# Patient Record
Sex: Female | Born: 1965 | Race: White | Hispanic: No | Marital: Married | Smoking: Never smoker
Health system: Southern US, Community
[De-identification: ages and names within clinical notes are randomized; demographics above are authoritative.]

## PROBLEM LIST (undated history)

## (undated) DIAGNOSIS — J45909 Unspecified asthma, uncomplicated: Secondary | ICD-10-CM

---

## 2014-06-28 ENCOUNTER — Other Ambulatory Visit (HOSPITAL_COMMUNITY): Payer: Self-pay | Admitting: Family Medicine

## 2014-06-28 DIAGNOSIS — Z1231 Encounter for screening mammogram for malignant neoplasm of breast: Secondary | ICD-10-CM

## 2014-07-06 ENCOUNTER — Ambulatory Visit (HOSPITAL_COMMUNITY)
Admission: RE | Admit: 2014-07-06 | Discharge: 2014-07-06 | Disposition: A | Discharge status: Home / Self Care | Source: Ambulatory Visit | Attending: Family Medicine | Admitting: Family Medicine

## 2014-07-06 DIAGNOSIS — Z1231 Encounter for screening mammogram for malignant neoplasm of breast: Secondary | ICD-10-CM | POA: Diagnosis present

## 2014-12-17 ENCOUNTER — Encounter (HOSPITAL_COMMUNITY): Payer: Self-pay | Admitting: *Deleted

## 2014-12-17 ENCOUNTER — Emergency Department (HOSPITAL_COMMUNITY)

## 2014-12-17 ENCOUNTER — Emergency Department (HOSPITAL_COMMUNITY)
Admission: EM | Admit: 2014-12-17 | Discharge: 2014-12-17 | Disposition: A | Discharge status: Home / Self Care | Attending: Emergency Medicine | Admitting: Emergency Medicine

## 2014-12-17 DIAGNOSIS — J45909 Unspecified asthma, uncomplicated: Secondary | ICD-10-CM | POA: Diagnosis not present

## 2014-12-17 DIAGNOSIS — R1010 Upper abdominal pain, unspecified: Secondary | ICD-10-CM | POA: Diagnosis present

## 2014-12-17 DIAGNOSIS — Z3202 Encounter for pregnancy test, result negative: Secondary | ICD-10-CM | POA: Insufficient documentation

## 2014-12-17 HISTORY — DX: Unspecified asthma, uncomplicated: J45.909

## 2014-12-17 LAB — URINALYSIS, ROUTINE W REFLEX MICROSCOPIC
Bilirubin Urine: NEGATIVE
Glucose, UA: NEGATIVE mg/dL
HGB URINE DIPSTICK: NEGATIVE
Ketones, ur: NEGATIVE mg/dL
Nitrite: NEGATIVE
PH: 7 (ref 5.0–8.0)
PROTEIN: NEGATIVE mg/dL
SPECIFIC GRAVITY, URINE: 1.007 (ref 1.005–1.030)
Urobilinogen, UA: 0.2 mg/dL (ref 0.0–1.0)

## 2014-12-17 LAB — CBC WITH DIFFERENTIAL/PLATELET
BASOS ABS: 0 10*3/uL (ref 0.0–0.1)
Basophils Relative: 1 % (ref 0–1)
Eosinophils Absolute: 0.1 10*3/uL (ref 0.0–0.7)
Eosinophils Relative: 3 % (ref 0–5)
HEMATOCRIT: 39.6 % (ref 36.0–46.0)
HEMOGLOBIN: 13.7 g/dL (ref 12.0–15.0)
LYMPHS PCT: 34 % (ref 12–46)
Lymphs Abs: 1.6 10*3/uL (ref 0.7–4.0)
MCH: 31.7 pg (ref 26.0–34.0)
MCHC: 34.6 g/dL (ref 30.0–36.0)
MCV: 91.7 fL (ref 78.0–100.0)
Monocytes Absolute: 0.4 10*3/uL (ref 0.1–1.0)
Monocytes Relative: 8 % (ref 3–12)
Neutro Abs: 2.7 10*3/uL (ref 1.7–7.7)
Neutrophils Relative %: 54 % (ref 43–77)
Platelets: 135 10*3/uL — ABNORMAL LOW (ref 150–400)
RBC: 4.32 MIL/uL (ref 3.87–5.11)
RDW: 12.1 % (ref 11.5–15.5)
WBC: 4.9 10*3/uL (ref 4.0–10.5)

## 2014-12-17 LAB — COMPREHENSIVE METABOLIC PANEL
ALBUMIN: 4 g/dL (ref 3.5–5.0)
ALT: 23 U/L (ref 14–54)
AST: 24 U/L (ref 15–41)
Alkaline Phosphatase: 31 U/L — ABNORMAL LOW (ref 38–126)
Anion gap: 6 (ref 5–15)
BUN: 10 mg/dL (ref 6–20)
CO2: 26 mmol/L (ref 22–32)
Calcium: 9.4 mg/dL (ref 8.9–10.3)
Chloride: 106 mmol/L (ref 101–111)
Creatinine, Ser: 0.95 mg/dL (ref 0.44–1.00)
GFR calc Af Amer: >60 mL/min (ref >60)
GFR calc non Af Amer: >60 mL/min (ref >60)
Glucose, Bld: 103 mg/dL — ABNORMAL HIGH (ref 65–99)
POTASSIUM: 4.3 mmol/L (ref 3.5–5.1)
SODIUM: 138 mmol/L (ref 135–145)
TOTAL PROTEIN: 6.2 g/dL — AB (ref 6.5–8.1)
Total Bilirubin: 0.9 mg/dL (ref 0.3–1.2)

## 2014-12-17 LAB — URINE MICROSCOPIC-ADD ON

## 2014-12-17 LAB — PREGNANCY, URINE: PREG TEST UR: NEGATIVE

## 2014-12-17 LAB — LIPASE, BLOOD: Lipase: 19 U/L — ABNORMAL LOW (ref 22–51)

## 2014-12-17 MED ORDER — SODIUM CHLORIDE 0.9 % IV SOLN
INTRAVENOUS | Status: DC
  Administered 2014-12-17: 18:00:00 via INTRAVENOUS

## 2014-12-17 MED ORDER — IOHEXOL 300 MG/ML  SOLN
100.0000 mL | Freq: Once | INTRAMUSCULAR | Status: AC | PRN
  Administered 2014-12-17: 100 mL via INTRAVENOUS

## 2014-12-17 MED ORDER — SENNOSIDES-DOCUSATE SODIUM 8.6-50 MG PO TABS: 2.0000 | ORAL_TABLET | Freq: Every day | ORAL | Status: AC

## 2014-12-17 MED ORDER — FENTANYL CITRATE (PF) 100 MCG/2ML IJ SOLN
25.0000 ug | Freq: Once | INTRAMUSCULAR | Status: AC
  Administered 2014-12-17: 25 ug via INTRAVENOUS
  Filled 2014-12-17: qty 2

## 2014-12-17 MED ORDER — MAGNESIUM CITRATE PO SOLN: 1.0000 | Freq: Once | ORAL | Status: AC

## 2014-12-17 MED ORDER — GI COCKTAIL ~~LOC~~
30.0000 mL | Freq: Once | ORAL | Status: AC
  Administered 2014-12-17: 30 mL via ORAL
  Filled 2014-12-17: qty 30

## 2014-12-17 MED ORDER — IOHEXOL 300 MG/ML  SOLN
25.0000 mL | Freq: Once | INTRAMUSCULAR | Status: AC | PRN
  Administered 2014-12-17: 25 mL via ORAL

## 2014-12-17 NOTE — ED Notes (Signed)
The pt is c/o abd pain since Wednesday flank pain since thuirsday.  Pain worse since yesterday.  lmp none

## 2014-12-17 NOTE — Discharge Instructions (Signed)
As discussed, today's evaluation is largely reassuring, though there is a notable amount of stool throughout the colon, this is likely contributing to your abdominal discomfort, back pain. Please be sure to take all medication as directed, and follow up with your primary care physician.  If you develop new, or concerning changes, please be sure to return here immediately.

## 2014-12-17 NOTE — ED Notes (Signed)
Patient states she is unable to get comfortable

## 2014-12-17 NOTE — ED Provider Notes (Signed)
CSN: [id number]     Arrival date & time 12/17/14  1657 History   First MD Initiated Contact with Patient 12/17/14 1713     Chief Complaint  Patient presents with  . Abdominal Pain     (Consider location/radiation/quality/duration/timing/severity/associated sxs/prior Treatment) HPI She presents with concern of abdominal and back pain. Pain began 3 days ago. Initially the pain was across the upper abdomen, and on the left mid thoracic area. Subsequent, the patient has spread to include the entirety of the mid thoracic back, as well as the entirety of the upper abdomen. Pain is severe, sore, awakened the patient last night. No relief with OTC medication. There is associated anorexia, nausea, but no vomiting, diarrhea, urinary changes. No fever, chills. Patient is healthy, denies substantial medical problems.   Past Medical History  Diagnosis Date  . Asthma    History reviewed. No pertinent past surgical history. No family history on file. History  Substance Use Topics  . Smoking status: Never Smoker   . Smokeless tobacco: Not on file  . Alcohol Use: No   OB History    No data available     Review of Systems  Constitutional:       Per HPI, otherwise negative  HENT:       Per HPI, otherwise negative  Respiratory:       Per HPI, otherwise negative  Cardiovascular:       Per HPI, otherwise negative  Gastrointestinal: Negative for vomiting.  Endocrine:       Negative aside from HPI  Genitourinary:       Neg aside from HPI   Musculoskeletal:       Per HPI, otherwise negative  Skin: Negative.   Neurological: Negative for syncope.      Allergies  Aspirin; Codeine; and Doxycycline  Home Medications   Prior to Admission medications   Medication Sig Start Date End Date Taking? Authorizing Provider  magnesium citrate SOLN Take 296 mLs (1 Bottle total) by mouth once. 12/17/14   Gerhard Munch, MD  senna-docusate (SENOKOT-S) 8.6-50 MG per tablet Take 2 tablets by  mouth daily. 12/18/14 01/01/15  Gerhard Munch, MD   BP 132/73 mmHg  Pulse 47  Temp(Src) 98.5 F (36.9 C) (Oral)  Resp 14  Ht  (1.575 m)  Wt 130 lb (58.968 kg)  BMI 23.77 kg/m2  SpO2 100% Physical Exam  Constitutional: She is oriented to person, place, and time. She appears well-developed and well-nourished. No distress.  HENT:  Head: Normocephalic and atraumatic.  Eyes: Conjunctivae and EOM are normal.  Cardiovascular: Normal rate and regular rhythm.   Pulmonary/Chest: Effort normal and breath sounds normal. No stridor. No respiratory distress.  Abdominal: She exhibits no distension.    Musculoskeletal: She exhibits no edema.       Arms: Neurological: She is alert and oriented to person, place, and time. No cranial nerve deficit.  Skin: Skin is warm and dry.  Psychiatric: She has a normal mood and affect.  Nursing note and vitals reviewed.   ED Course  Procedures (including critical care time) Labs Review Labs Reviewed  CBC WITH DIFFERENTIAL/PLATELET - Abnormal; Notable for the following:    Platelets 135 (*)    All other components within normal limits  COMPREHENSIVE METABOLIC PANEL - Abnormal; Notable for the following:    Glucose, Bld 103 (*)    Total Protein 6.2 (*)    Alkaline Phosphatase 31 (*)    All other components within normal limits  LIPASE,  BLOOD - Abnormal; Notable for the following:    Lipase 19 (*)    All other components within normal limits  URINALYSIS, ROUTINE W REFLEX MICROSCOPIC (NOT AT [facility]) - Abnormal; Notable for the following:    Leukocytes, UA TRACE (*)    All other components within normal limits  URINE MICROSCOPIC-ADD ON - Abnormal; Notable for the following:    Squamous Epithelial / LPF FEW (*)    All other components within normal limits  PREGNANCY, URINE    After the patient had initial labs, and I discussed him with her, she continued to complain of abdominal pain, back pain, in spite of receiving narcotic medication. Given  the persistency of pain in this otherwise healthy lady, CT scans performed   Imaging Review Ct Abdomen Pelvis W Contrast  12/17/2014   CLINICAL DATA:  Acute right upper quadrant abdominal pain.  EXAM: CT ABDOMEN AND PELVIS WITH CONTRAST  TECHNIQUE: Multidetector CT imaging of the abdomen and pelvis was performed using the standard protocol following bolus administration of intravenous contrast.  CONTRAST:  100mL OMNIPAQUE IOHEXOL 300 MG/ML  SOLN  COMPARISON:  None.  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 Visualized lung bases appear normal.  No gallstones are noted. Multiple cysts are noted throughout hepatic parenchyma. The spleen and pancreas appear normal. Adrenal glands and kidneys appear normal. No hydronephrosis or renal obstruction is noted. There is no evidence of bowel obstruction. Large amount of stool is noted throughout the colon. No abnormal fluid collection is noted. Uterus and ovaries are unremarkable. Urinary bladder appears normal. No significant adenopathy is noted.  IMPRESSION: Multiple small hepatic cysts are noted.  Large amount of stool is noted throughout the colon suggesting constipation.  No other significant abnormality seen in the abdomen or pelvis.   Electronically Signed   By: Lupita RaiderJames  Green Jr, M.D.   On: 12/17/2014 21:31    On repeat exam the patient appears calm. Review length conversation about all findings, including presence of large amounts of stool in the colon. Patient states that she had one prior similar episode with substantial abdominal discomfort.   MDM   Final diagnoses:  Pain of upper abdomen   This healthy female presents with ongoing back, abdominal pain. Patient is awake, alert, neurologically intact, hemodynamically stable. Pulses are appreciable in her extremities, and there are no neurologic complaints, and there is low suspicion for neurovascular compromise, such as abdominal  aneurysm. Patient's evaluation here is largely reassuring, though there is demonstrated presence of substantial amounts of stool in the colon, suggesting constipation as at least contributory etiology. Given the patient's generally good health, he was anemic stability, she started on a course of medication, discharged in stable condition after being provided outpatient referral.   Gerhard Munchobert Marisabel Macpherson, MD 12/17/14 2205

## 2015-02-14 DIAGNOSIS — J4520 Mild intermittent asthma, uncomplicated: Secondary | ICD-10-CM | POA: Insufficient documentation

## 2015-02-14 DIAGNOSIS — H1010 Acute atopic conjunctivitis, unspecified eye: Secondary | ICD-10-CM | POA: Insufficient documentation

## 2015-02-14 DIAGNOSIS — J309 Allergic rhinitis, unspecified: Principal | ICD-10-CM

## 2015-03-07 ENCOUNTER — Other Ambulatory Visit: Payer: Self-pay | Admitting: *Deleted

## 2015-03-08 ENCOUNTER — Telehealth: Payer: Self-pay | Admitting: Allergy and Immunology

## 2015-03-08 DIAGNOSIS — J309 Allergic rhinitis, unspecified: Principal | ICD-10-CM

## 2015-03-08 DIAGNOSIS — H1010 Acute atopic conjunctivitis, unspecified eye: Secondary | ICD-10-CM

## 2015-03-08 NOTE — Telephone Encounter (Signed)
[facility] called about [place] faxing Allegra to them yesterday.  She said we need to start e-scibing and to pls do this. She has not honored the refill sent yesterday, 03/07/2015

## 2015-03-09 ENCOUNTER — Ambulatory Visit (INDEPENDENT_AMBULATORY_CARE_PROVIDER_SITE_OTHER)

## 2015-03-09 ENCOUNTER — Encounter

## 2015-03-09 DIAGNOSIS — J309 Allergic rhinitis, unspecified: Secondary | ICD-10-CM | POA: Diagnosis not present

## 2015-03-09 DIAGNOSIS — H1010 Acute atopic conjunctivitis, unspecified eye: Secondary | ICD-10-CM

## 2015-03-09 MED ORDER — FEXOFENADINE HCL 180 MG PO TABS: 180.0000 mg | ORAL_TABLET | Freq: Every day | ORAL | Status: DC

## 2015-03-09 NOTE — Telephone Encounter (Signed)
Fexofenadine 180 #90 with 3 refills sent to [facility].

## 2015-03-09 NOTE — Progress Notes (Signed)
This encounter was created in error - please disregard.

## 2015-03-16 ENCOUNTER — Ambulatory Visit (INDEPENDENT_AMBULATORY_CARE_PROVIDER_SITE_OTHER)

## 2015-03-16 DIAGNOSIS — J309 Allergic rhinitis, unspecified: Secondary | ICD-10-CM | POA: Diagnosis not present

## 2015-03-16 DIAGNOSIS — H1010 Acute atopic conjunctivitis, unspecified eye: Secondary | ICD-10-CM | POA: Diagnosis not present

## 2015-03-28 ENCOUNTER — Ambulatory Visit (INDEPENDENT_AMBULATORY_CARE_PROVIDER_SITE_OTHER)

## 2015-03-28 DIAGNOSIS — J309 Allergic rhinitis, unspecified: Secondary | ICD-10-CM

## 2015-03-28 DIAGNOSIS — H1010 Acute atopic conjunctivitis, unspecified eye: Secondary | ICD-10-CM | POA: Diagnosis not present

## 2015-04-10 ENCOUNTER — Ambulatory Visit (INDEPENDENT_AMBULATORY_CARE_PROVIDER_SITE_OTHER): Admitting: Neurology

## 2015-04-10 DIAGNOSIS — J309 Allergic rhinitis, unspecified: Secondary | ICD-10-CM | POA: Diagnosis not present

## 2015-04-25 ENCOUNTER — Ambulatory Visit (INDEPENDENT_AMBULATORY_CARE_PROVIDER_SITE_OTHER)

## 2015-04-25 DIAGNOSIS — J309 Allergic rhinitis, unspecified: Secondary | ICD-10-CM | POA: Diagnosis not present

## 2015-05-08 ENCOUNTER — Telehealth: Payer: Self-pay | Admitting: Allergy and Immunology

## 2015-05-08 MED ORDER — AZELASTINE HCL 0.1 % NA SOLN: 1.0000 | Freq: Every day | NASAL | Status: DC

## 2015-05-08 NOTE — Telephone Encounter (Signed)
Needs a refill sent into her pharmacy for her Azelastine. [facility] [place]

## 2015-05-08 NOTE — Telephone Encounter (Signed)
Sent in rx to patients pharmacy and patient notified.

## 2015-05-15 ENCOUNTER — Ambulatory Visit (INDEPENDENT_AMBULATORY_CARE_PROVIDER_SITE_OTHER): Admitting: Neurology

## 2015-05-15 DIAGNOSIS — J309 Allergic rhinitis, unspecified: Secondary | ICD-10-CM

## 2015-05-30 ENCOUNTER — Ambulatory Visit (INDEPENDENT_AMBULATORY_CARE_PROVIDER_SITE_OTHER)

## 2015-05-30 DIAGNOSIS — J309 Allergic rhinitis, unspecified: Secondary | ICD-10-CM

## 2015-06-13 ENCOUNTER — Ambulatory Visit (INDEPENDENT_AMBULATORY_CARE_PROVIDER_SITE_OTHER)

## 2015-06-13 DIAGNOSIS — J309 Allergic rhinitis, unspecified: Secondary | ICD-10-CM | POA: Diagnosis not present

## 2015-06-20 ENCOUNTER — Other Ambulatory Visit: Payer: Self-pay

## 2015-06-20 DIAGNOSIS — Z1231 Encounter for screening mammogram for malignant neoplasm of breast: Secondary | ICD-10-CM

## 2015-06-28 ENCOUNTER — Ambulatory Visit (INDEPENDENT_AMBULATORY_CARE_PROVIDER_SITE_OTHER): Admitting: Neurology

## 2015-06-28 DIAGNOSIS — J309 Allergic rhinitis, unspecified: Secondary | ICD-10-CM | POA: Diagnosis not present

## 2015-07-07 ENCOUNTER — Ambulatory Visit

## 2015-07-13 ENCOUNTER — Ambulatory Visit (INDEPENDENT_AMBULATORY_CARE_PROVIDER_SITE_OTHER)

## 2015-07-13 DIAGNOSIS — J309 Allergic rhinitis, unspecified: Secondary | ICD-10-CM | POA: Diagnosis not present

## 2015-07-27 ENCOUNTER — Other Ambulatory Visit: Payer: Self-pay

## 2015-07-27 MED ORDER — LEVALBUTEROL TARTRATE 45 MCG/ACT IN AERO: INHALATION_SPRAY | RESPIRATORY_TRACT | Status: DC

## 2015-07-27 MED ORDER — OLOPATADINE HCL 0.1 % OP SOLN: OPHTHALMIC | Status: DC

## 2015-07-27 MED ORDER — SALINE 0.9 % NA AERS: INHALATION_SPRAY | NASAL | Status: AC

## 2015-07-27 MED ORDER — EPINEPHRINE 0.3 MG/0.3ML IJ SOAJ: INTRAMUSCULAR | Status: AC

## 2015-07-28 ENCOUNTER — Other Ambulatory Visit: Payer: Self-pay

## 2015-07-28 ENCOUNTER — Telehealth: Payer: Self-pay

## 2015-07-28 MED ORDER — OLOPATADINE HCL 0.2 % OP SOLN: 1.0000 [drp] | OPHTHALMIC | Status: DC

## 2015-07-28 MED ORDER — ALBUTEROL SULFATE HFA 108 (90 BASE) MCG/ACT IN AERS: 2.0000 | INHALATION_SPRAY | RESPIRATORY_TRACT | Status: AC | PRN

## 2015-07-28 NOTE — Telephone Encounter (Signed)
Dr. Lucie Leather pharmacy called and stated that they do not carry the xopenex inhaler and that they have proair. Can we s

## 2015-07-28 NOTE — Telephone Encounter (Signed)
Medication sen to pharmacy

## 2015-08-15 ENCOUNTER — Ambulatory Visit (INDEPENDENT_AMBULATORY_CARE_PROVIDER_SITE_OTHER)

## 2015-08-15 DIAGNOSIS — J309 Allergic rhinitis, unspecified: Secondary | ICD-10-CM | POA: Diagnosis not present

## 2015-08-25 ENCOUNTER — Ambulatory Visit (INDEPENDENT_AMBULATORY_CARE_PROVIDER_SITE_OTHER): Admitting: *Deleted

## 2015-08-25 DIAGNOSIS — J309 Allergic rhinitis, unspecified: Secondary | ICD-10-CM

## 2015-08-31 ENCOUNTER — Ambulatory Visit (INDEPENDENT_AMBULATORY_CARE_PROVIDER_SITE_OTHER)

## 2015-08-31 DIAGNOSIS — J309 Allergic rhinitis, unspecified: Secondary | ICD-10-CM | POA: Diagnosis not present

## 2015-09-15 ENCOUNTER — Ambulatory Visit (INDEPENDENT_AMBULATORY_CARE_PROVIDER_SITE_OTHER): Admitting: *Deleted

## 2015-09-15 DIAGNOSIS — J309 Allergic rhinitis, unspecified: Secondary | ICD-10-CM

## 2015-09-21 ENCOUNTER — Ambulatory Visit: Payer: Self-pay

## 2015-10-06 ENCOUNTER — Ambulatory Visit (INDEPENDENT_AMBULATORY_CARE_PROVIDER_SITE_OTHER): Admitting: *Deleted

## 2015-10-06 DIAGNOSIS — J309 Allergic rhinitis, unspecified: Secondary | ICD-10-CM

## 2015-10-13 ENCOUNTER — Ambulatory Visit (INDEPENDENT_AMBULATORY_CARE_PROVIDER_SITE_OTHER): Admitting: *Deleted

## 2015-10-13 DIAGNOSIS — J309 Allergic rhinitis, unspecified: Secondary | ICD-10-CM

## 2015-10-18 ENCOUNTER — Ambulatory Visit: Payer: Self-pay | Admitting: *Deleted

## 2015-10-18 ENCOUNTER — Ambulatory Visit (INDEPENDENT_AMBULATORY_CARE_PROVIDER_SITE_OTHER): Admitting: Allergy and Immunology

## 2015-10-18 ENCOUNTER — Encounter: Payer: Self-pay | Admitting: Allergy and Immunology

## 2015-10-18 VITALS — BP 100/60 | HR 68 | Resp 16

## 2015-10-18 DIAGNOSIS — L700 Acne vulgaris: Secondary | ICD-10-CM

## 2015-10-18 DIAGNOSIS — J4520 Mild intermittent asthma, uncomplicated: Secondary | ICD-10-CM

## 2015-10-18 DIAGNOSIS — J309 Allergic rhinitis, unspecified: Secondary | ICD-10-CM

## 2015-10-18 DIAGNOSIS — H1010 Acute atopic conjunctivitis, unspecified eye: Secondary | ICD-10-CM

## 2015-10-18 NOTE — Patient Instructions (Signed)
  1. Continue immunotherapy as directed by Dr. Evelene CroonKaur  2. Continue EpiPen if needed  3. Visit with dermatologist to address adult-onset acne  4. Continue action plan for asthma flare up including use of Flovent 222 inhalations twice a day  5. Continue ProAir HFA 2 puffs every 4-6 hours if needed. May use prior to exercise  6. Continue nasal Astelin 2 sprays each nostril one-2 times per day if needed  7. Continue Pataday one drop each eye one time per day if needed  8. Return to clinic in 1 year or earlier if problem

## 2015-10-18 NOTE — Progress Notes (Signed)
Follow-up Note  Referring Provider: No ref. provider found Primary Provider: No PCP Per Patient Date of Office Visit: 10/18/2015  Subjective:   Colleen Adams (DOB: [birth year]) is a 50 y.o. female who [facility] in re-evaluation of the following:  HPI: Colleen Adams presents this clinic in evaluation of her allergic rhinoconjunctivitis and mild intermittent asthma treated with immunotherapy from Dr. Evelene CroonKaur. Apparently she's done well over the course the past year but has had some problems with her immunotherapy. She had a four-week bout of "bronchitis" in February and when she restarted her immunotherapy after a four-week hiatus she did develop a large local reaction and maybe some hives around the injection site. She also developed a facial and chest dermatitis around the same time that has been persistent. She had an injection today and has not had any adverse effects secondary to this exposure. Her allergic rhinoconjunctivitis is under very good control with her current medical therapy which includes immunotherapy plus Astelin. On occasion she will use Pataday for her eyes. Her asthma is also been under very good control. She will use a short acting bronchodilator and inhaled steroid prior to exercising. This averages out to about 3 times per week.She is not required a systemic steroid to treat this condition.  Colleen Adams has developed a significant dermatitis on her face and trunk sometime in March. She had adult onset acne requiring the administration of Accutane many years ago and it appears as though this issue has redeveloped. She was given Differin by Dr. Evelene CroonKaur but this is not resulting in good control of her condition.    Medication List           albuterol 108 (90 Base) MCG/ACT inhaler  Commonly known as:  PROAIR HFA  Inhale 2 puffs into the lungs every 4 (four) hours as needed for wheezing or shortness of breath.     azelastine 0.1 % nasal spray    Commonly known as:  ASTELIN  Place 1 spray into both nostrils daily. Use in each nostril as directed     EPINEPHrine 0.3 mg/0.3 mL Soaj injection  Commonly known as:  EPIPEN 2-PAK  Use as directed for a severe allergic reaction.     fexofenadine 180 MG tablet  Commonly known as:  ALLEGRA  Take 1 tablet (180 mg total) by mouth daily.     fluticasone 220 MCG/ACT inhaler  Commonly known as:  FLOVENT HFA  Inhale 2 puffs into the lungs as needed.     guaiFENesin 600 MG 12 hr tablet  Commonly known as:  MUCINEX  Take 600 mg by mouth 2 (two) times daily.     magnesium citrate Soln  Take 296 mLs (1 Bottle total) by mouth once.     Olopatadine HCl 0.2 % Soln  Commonly known as:  PATADAY  Place 1 drop into both eyes 1 day or 1 dose.     pseudoephedrine-guaifenesin 60-600 MG 12 hr tablet  Commonly known as:  MUCINEX D  Take 1 tablet by mouth every 12 (twelve) hours.     Saline 0.9 % Aers  Commonly known as:  SIMPLY SALINE  Use 1-2 sprays in each nostril twice daily as needed for congestion.        Past Medical History  Diagnosis Date  . Asthma     No past surgical history on file.  Allergies  Allergen Reactions  . Aspirin   . Codeine   . Doxycycline  Review of systems negative except as noted in HPI / PMHx or noted below:  Review of Systems  Constitutional: Negative.   HENT: Negative.   Eyes: Negative.   Respiratory: Negative.   Cardiovascular: Negative.   Gastrointestinal: Negative.   Genitourinary: Negative.   Musculoskeletal: Negative.   Skin: Negative.   Neurological: Negative.   Endo/Heme/Allergies: Negative.   Psychiatric/Behavioral: Negative.      Objective:   Filed Vitals:   10/18/15 0857  BP: 100/60  Pulse: 68  Resp: 16          Physical Exam  Constitutional: She is well-developed, well-nourished, and in no distress.  HENT:  Head: Normocephalic.  Right Ear: Tympanic membrane, external ear and ear canal normal.  Left Ear: Tympanic  membrane, external ear and ear canal normal.  Nose: Nose normal. No mucosal edema or rhinorrhea.  Mouth/Throat: Uvula is midline, oropharynx is clear and moist and mucous membranes are normal. No oropharyngeal exudate.  Eyes: Conjunctivae are normal.  Neck: Trachea normal. No tracheal tenderness present. No tracheal deviation present. No thyromegaly present.  Cardiovascular: Normal rate, regular rhythm, S1 normal, S2 normal and normal heart sounds.   No murmur heard. Pulmonary/Chest: Breath sounds normal. No stridor. No respiratory distress. She has no wheezes. She has no rales.  Musculoskeletal: She exhibits no edema.  Lymphadenopathy:       Head (right side): No tonsillar adenopathy present.       Head (left side): No tonsillar adenopathy present.    She has no cervical adenopathy.  Neurological: She is alert. Gait normal.  Skin: Rash (Face and anterior chest acneiform-like lesions) noted. She is not diaphoretic. No erythema. Nails show no clubbing.  Psychiatric: Mood and affect normal.    Diagnostics:    Spirometry was performed and demonstrated an FEV1 of 2.66 at 113 % of predicted.   Assessment and Plan:   1. Asthma, mild intermittent, well-controlled   2. Allergic rhinoconjunctivitis   3. Acne vulgaris     1. Continue immunotherapy as directed by Dr. Evelene Croon  2. Continue EpiPen if needed  3. Visit with dermatologist to address adult-onset acne  4. Continue action plan for asthma flare up including use of Flovent 220 - 2 inhalations twice a day  5. Continue ProAir HFA 2 puffs every 4-6 hours if needed. May use prior to exercise  6. Continue nasal Astelin 2 sprays each nostril one-2 times per day if needed  7. Continue Pataday one drop each eye one time per day if needed  8. Return to clinic in 1 year or earlier if problem  Overall Colleen Adams is doing relatively well and she can continue on immunotherapy as administered by Dr.Kaur. She is receiving this immunotherapy in our  clinic. We will follow the administration directions as specified by her doctor. She will use ProAir HFA if needed prior to exercise and I've asked her to reserve the use of Flovent for an action plan. I think it's necessary to see if she she requires Flovent 3 times per week to maintain good control of her asthma. I suspect that this is not the case. She really needs to see a dermatologist about her adult onset acne as I doubt different is going to take care of her problems she does appear to have some cystic lesions already. She is definitely a candidate for Accutane again. She will discuss this dermatological evaluation with her primary care doctor. I will see her back in this clinic in 1 year or earlier if  problem.  Laurette Schimke, MD [facility]

## 2015-10-30 ENCOUNTER — Ambulatory Visit (INDEPENDENT_AMBULATORY_CARE_PROVIDER_SITE_OTHER)

## 2015-10-30 DIAGNOSIS — J309 Allergic rhinitis, unspecified: Secondary | ICD-10-CM | POA: Diagnosis not present

## 2016-01-29 ENCOUNTER — Telehealth: Payer: Self-pay | Admitting: Allergy and Immunology

## 2016-01-29 DIAGNOSIS — J309 Allergic rhinitis, unspecified: Principal | ICD-10-CM

## 2016-01-29 DIAGNOSIS — H1010 Acute atopic conjunctivitis, unspecified eye: Secondary | ICD-10-CM

## 2016-01-30 MED ORDER — AZELASTINE HCL 0.1 % NA SOLN: 1.0000 | Freq: Every day | NASAL | 2 refills | Status: AC

## 2016-01-30 MED ORDER — PSEUDOEPHEDRINE-GUAIFENESIN ER 60-600 MG PO TB12: 1.0000 | ORAL_TABLET | Freq: Two times a day (BID) | ORAL | 2 refills | Status: AC

## 2016-01-30 MED ORDER — FEXOFENADINE HCL 180 MG PO TABS: 180.0000 mg | ORAL_TABLET | Freq: Every day | ORAL | 2 refills | Status: AC

## 2016-01-30 MED ORDER — OLOPATADINE HCL 0.2 % OP SOLN: 1.0000 [drp] | OPHTHALMIC | 2 refills | Status: AC

## 2016-01-30 MED ORDER — GUAIFENESIN ER 600 MG PO TB12: 600.0000 mg | ORAL_TABLET | Freq: Two times a day (BID) | ORAL | 2 refills | Status: AC | PRN

## 2016-01-30 NOTE — Telephone Encounter (Signed)
Prescriptions sent, left message for patient advising same

## 2016-08-05 IMAGING — CT CT ABD-PELV W/ CM
2 of 5 series · 11 of 46 positions shown, 12 images · IV contrast (Iodine)
Comparison: None.

CLINICAL DATA: Acute right upper quadrant abdominal pain.

EXAM:
CT ABDOMEN AND PELVIS WITH CONTRAST
TECHNIQUE: Multidetector CT imaging of the abdomen and pelvis was performed
using the standard protocol following bolus administration of
intravenous contrast.
CONTRAST:  100mL OMNIPAQUE IOHEXOL 300 MG/ML  SOLN

[Series 201: routine, idose (2) · axial · 0.75mm/px · z∈[-451,-71]mm · 8 of 88 slices shown, 9 images]
[im 6/88  soft-tissue]
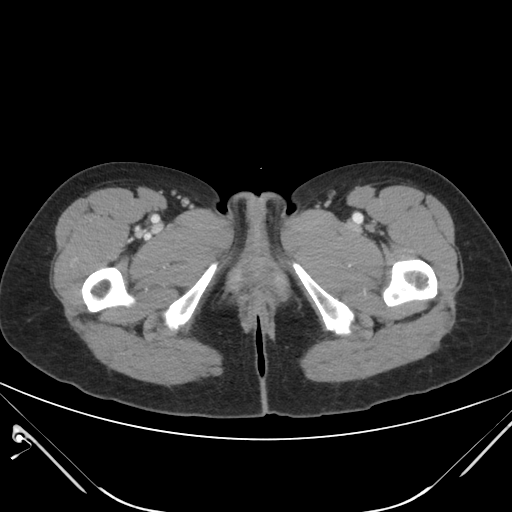
[im 6/88  bone]
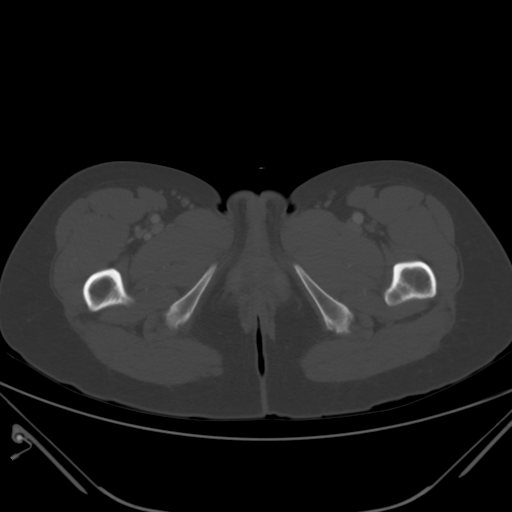
[im 17/88  soft-tissue]
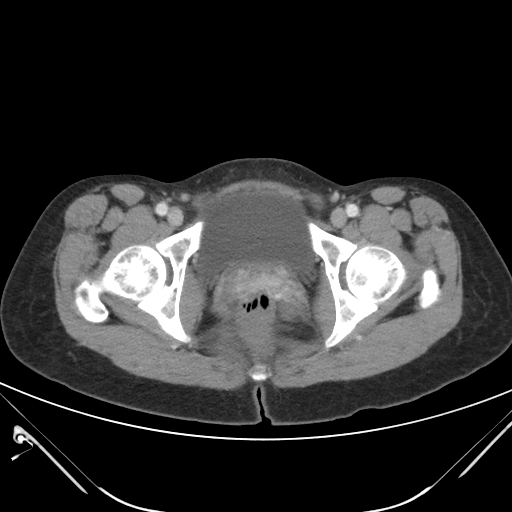
[im 28/88  soft-tissue]
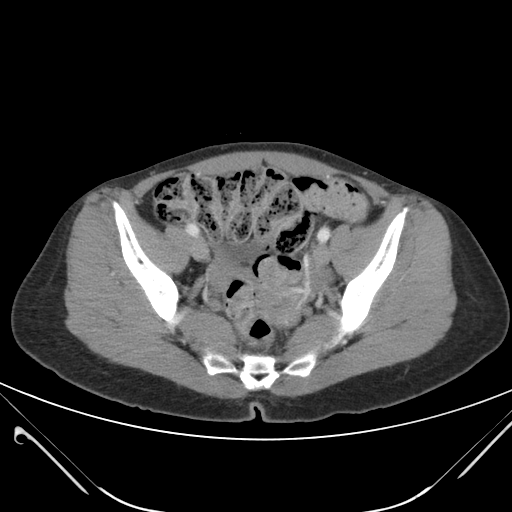
[im 39/88  soft-tissue]
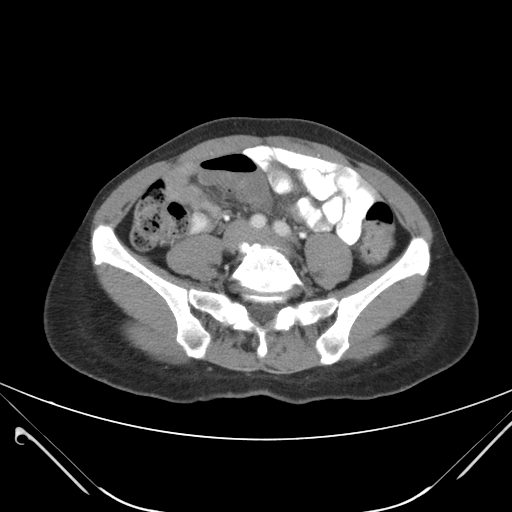
[im 49/88  soft-tissue]
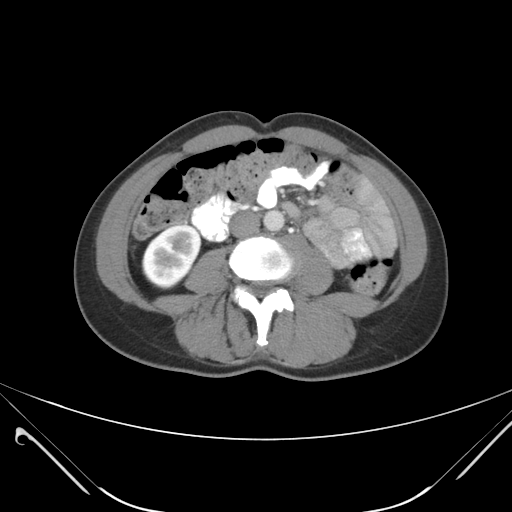
[im 60/88  soft-tissue]
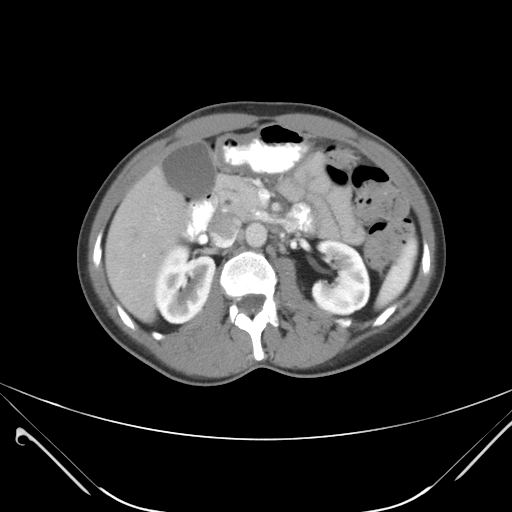
[im 71/88  soft-tissue]
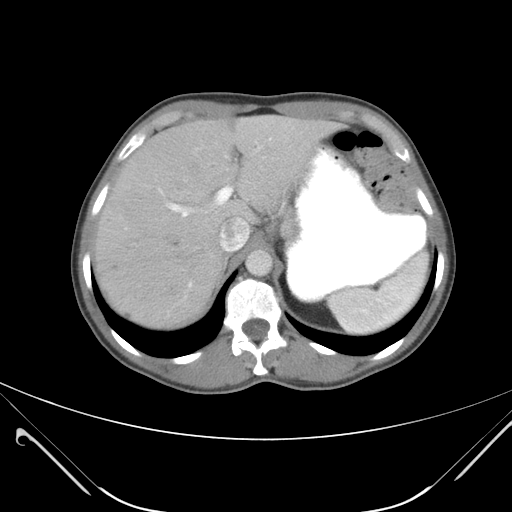
[im 82/88  soft-tissue]
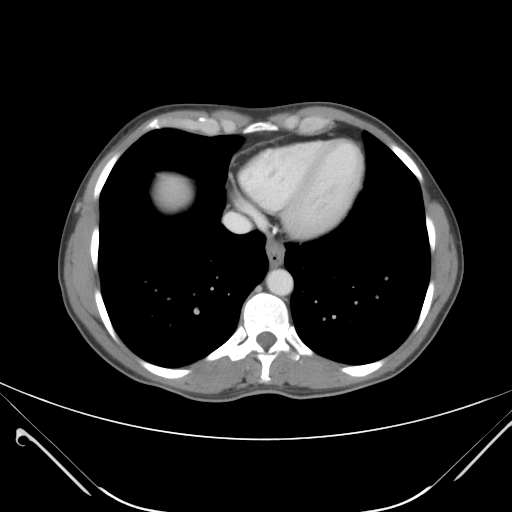

[Series 203: coronals, idose (2) · coronal · 0.45mm/px · 3 of 97 slices shown]
[im 33/97  soft-tissue]
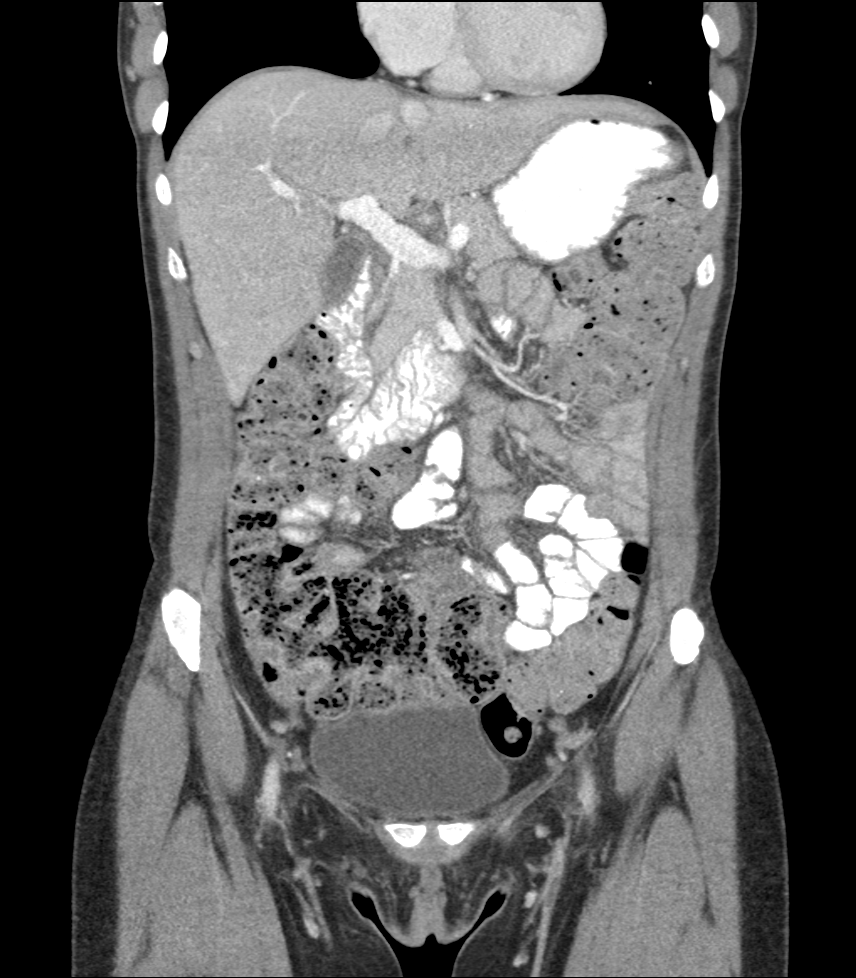
[im 43/97  soft-tissue]
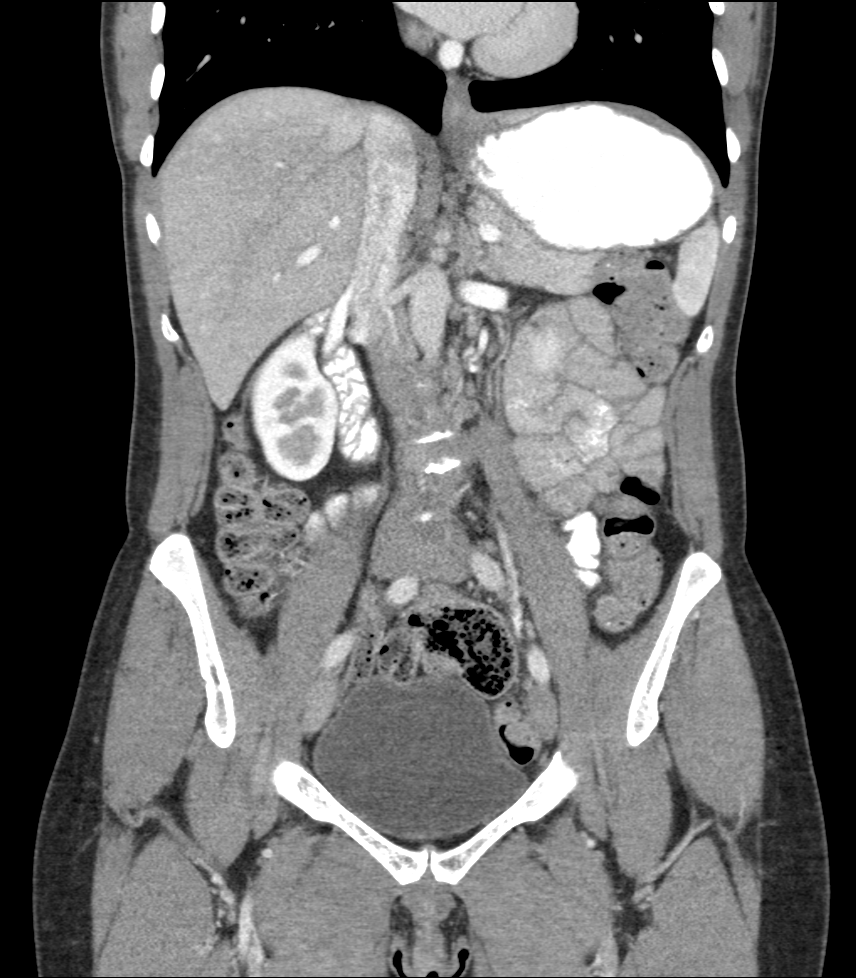
[im 54/97  soft-tissue]
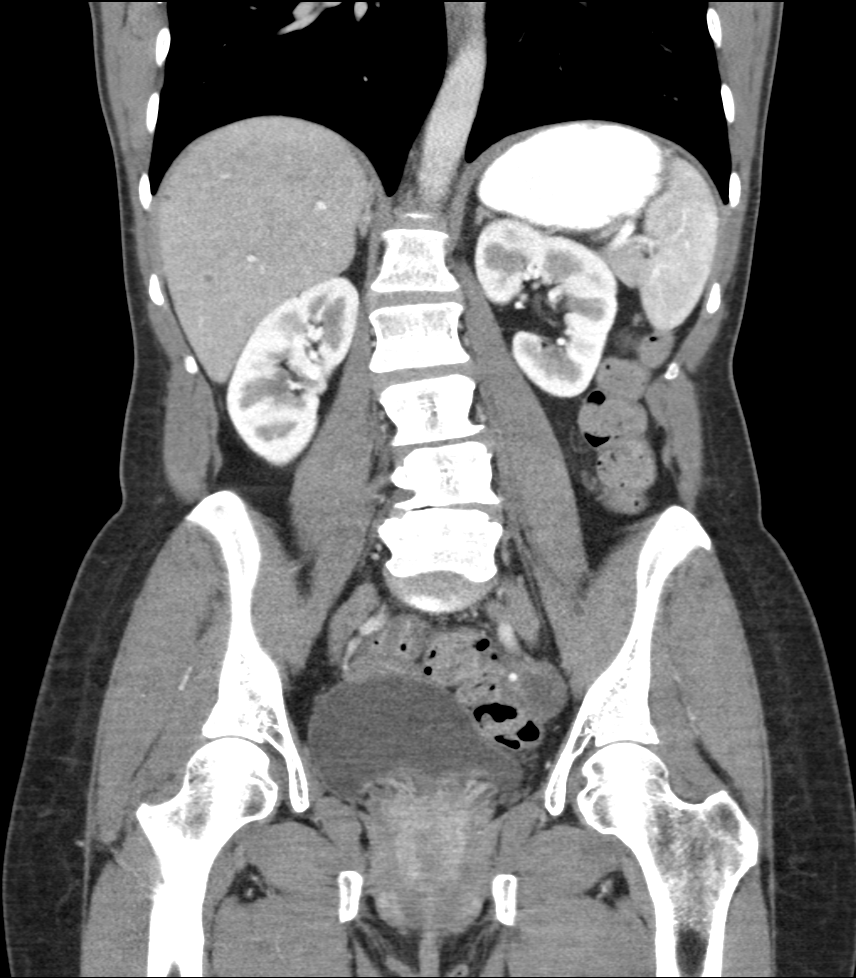

[11 of 46 positions shown; findings below may reference images not displayed]

FINDINGS: Severe degenerative disc disease is noted at L4-5 with bilateral
pars defects seen at L5. Visualized lung bases appear normal.

No gallstones are noted. Multiple cysts are noted throughout hepatic
parenchyma. The spleen and pancreas appear normal. Adrenal glands
and kidneys appear normal. No hydronephrosis or renal obstruction is
noted. There is no evidence of bowel obstruction. Large amount of
stool is noted throughout the colon. No abnormal fluid collection is
noted. Uterus and ovaries are unremarkable. Urinary bladder appears
normal. No significant adenopathy is noted.
IMPRESSION: Multiple small hepatic cysts are noted.

Large amount of stool is noted throughout the colon suggesting
constipation.

No other significant abnormality seen in the abdomen or pelvis.
